# Patient Record
Sex: Female | Born: 1965 | Race: White | Hispanic: No | Marital: Married | Smoking: Never smoker
Health system: Southern US, Community
[De-identification: ages and names within clinical notes are randomized; demographics above are authoritative.]

## PROBLEM LIST (undated history)

## (undated) ENCOUNTER — Emergency Department (HOSPITAL_COMMUNITY): Payer: Self-pay

## (undated) HISTORY — PX: NO PAST SURGERIES: SHX2092

---

## 1999-08-03 ENCOUNTER — Other Ambulatory Visit: Admission: RE | Admit: 1999-08-03 | Discharge: 1999-08-03 | Payer: Self-pay | Admitting: Gynecology

## 2000-08-08 ENCOUNTER — Other Ambulatory Visit: Admission: RE | Admit: 2000-08-08 | Discharge: 2000-08-08 | Payer: Self-pay | Admitting: Gynecology

## 2001-08-19 ENCOUNTER — Other Ambulatory Visit: Admission: RE | Admit: 2001-08-19 | Discharge: 2001-08-19 | Payer: Self-pay | Admitting: Gynecology

## 2002-08-29 ENCOUNTER — Other Ambulatory Visit: Admission: RE | Admit: 2002-08-29 | Discharge: 2002-08-29 | Payer: Self-pay | Admitting: Gynecology

## 2002-09-18 ENCOUNTER — Encounter: Payer: Self-pay | Admitting: Emergency Medicine

## 2002-09-18 ENCOUNTER — Emergency Department (HOSPITAL_COMMUNITY): Admission: EM | Admit: 2002-09-18 | Discharge: 2002-09-18 | Payer: Self-pay | Admitting: Emergency Medicine

## 2003-09-16 ENCOUNTER — Other Ambulatory Visit: Admission: RE | Admit: 2003-09-16 | Discharge: 2003-09-16 | Payer: Self-pay | Admitting: Gynecology

## 2004-10-10 ENCOUNTER — Other Ambulatory Visit: Admission: RE | Admit: 2004-10-10 | Discharge: 2004-10-10 | Payer: Self-pay | Admitting: Gynecology

## 2005-08-11 ENCOUNTER — Emergency Department (HOSPITAL_COMMUNITY): Admission: EM | Admit: 2005-08-11 | Discharge: 2005-08-11 | Payer: Self-pay | Admitting: Family Medicine

## 2005-11-15 ENCOUNTER — Other Ambulatory Visit: Admission: RE | Admit: 2005-11-15 | Discharge: 2005-11-15 | Payer: Self-pay | Admitting: Gynecology

## 2006-11-03 ENCOUNTER — Ambulatory Visit (HOSPITAL_COMMUNITY): Admission: RE | Admit: 2006-11-03 | Discharge: 2006-11-03 | Payer: Self-pay | Admitting: Internal Medicine

## 2006-11-27 ENCOUNTER — Other Ambulatory Visit: Admission: RE | Admit: 2006-11-27 | Discharge: 2006-11-27 | Payer: Self-pay | Admitting: Gynecology

## 2007-11-19 ENCOUNTER — Other Ambulatory Visit: Admission: RE | Admit: 2007-11-19 | Discharge: 2007-11-19 | Payer: Self-pay | Admitting: Gynecology

## 2008-05-29 ENCOUNTER — Encounter: Admission: RE | Admit: 2008-05-29 | Discharge: 2008-05-29 | Payer: Self-pay | Admitting: Gynecology

## 2008-09-26 ENCOUNTER — Emergency Department (HOSPITAL_COMMUNITY): Admission: EM | Admit: 2008-09-26 | Discharge: 2008-09-26 | Payer: Self-pay | Admitting: Emergency Medicine

## 2013-10-08 ENCOUNTER — Emergency Department (HOSPITAL_COMMUNITY): Payer: 59

## 2013-10-08 ENCOUNTER — Emergency Department (HOSPITAL_COMMUNITY)
Admission: EM | Admit: 2013-10-08 | Discharge: 2013-10-09 | Disposition: A | Discharge status: Home / Self Care | Payer: 59 | Attending: Emergency Medicine | Admitting: Emergency Medicine

## 2013-10-08 ENCOUNTER — Encounter (HOSPITAL_COMMUNITY): Payer: Self-pay | Admitting: Emergency Medicine

## 2013-10-08 DIAGNOSIS — R000 Tachycardia, unspecified: Secondary | ICD-10-CM | POA: Insufficient documentation

## 2013-10-08 DIAGNOSIS — R197 Diarrhea, unspecified: Secondary | ICD-10-CM | POA: Insufficient documentation

## 2013-10-08 DIAGNOSIS — M2540 Effusion, unspecified joint: Secondary | ICD-10-CM | POA: Insufficient documentation

## 2013-10-08 DIAGNOSIS — M79601 Pain in right arm: Secondary | ICD-10-CM

## 2013-10-08 DIAGNOSIS — Z880 Allergy status to penicillin: Secondary | ICD-10-CM | POA: Insufficient documentation

## 2013-10-08 DIAGNOSIS — R0602 Shortness of breath: Secondary | ICD-10-CM | POA: Insufficient documentation

## 2013-10-08 DIAGNOSIS — R1110 Vomiting, unspecified: Secondary | ICD-10-CM | POA: Insufficient documentation

## 2013-10-08 DIAGNOSIS — M79609 Pain in unspecified limb: Secondary | ICD-10-CM | POA: Insufficient documentation

## 2013-10-08 DIAGNOSIS — R002 Palpitations: Secondary | ICD-10-CM | POA: Insufficient documentation

## 2013-10-08 LAB — BASIC METABOLIC PANEL
BUN: 11 mg/dL (ref 6–23)
CHLORIDE: 106 meq/L (ref 96–112)
CO2: 24 mEq/L (ref 19–32)
Calcium: 9.2 mg/dL (ref 8.4–10.5)
Creatinine, Ser: 0.75 mg/dL (ref 0.50–1.10)
GFR calc Af Amer: >90 mL/min (ref >90)
GFR calc non Af Amer: >90 mL/min (ref >90)
GLUCOSE: 88 mg/dL (ref 70–99)
POTASSIUM: 4.8 meq/L (ref 3.7–5.3)
Sodium: 144 mEq/L (ref 137–147)

## 2013-10-08 LAB — CBC WITH DIFFERENTIAL/PLATELET
Basophils Absolute: 0 10*3/uL (ref 0.0–0.1)
Basophils Relative: 0 % (ref 0–1)
EOS ABS: 0.1 10*3/uL (ref 0.0–0.7)
Eosinophils Relative: 1 % (ref 0–5)
HCT: 38.3 % (ref 36.0–46.0)
HEMOGLOBIN: 13.2 g/dL (ref 12.0–15.0)
Lymphocytes Relative: 22 % (ref 12–46)
Lymphs Abs: 2.3 10*3/uL (ref 0.7–4.0)
MCH: 29.5 pg (ref 26.0–34.0)
MCHC: 34.5 g/dL (ref 30.0–36.0)
MCV: 85.5 fL (ref 78.0–100.0)
MONOS PCT: 9 % (ref 3–12)
Monocytes Absolute: 1 10*3/uL (ref 0.1–1.0)
NEUTROS ABS: 7 10*3/uL (ref 1.7–7.7)
NEUTROS PCT: 68 % (ref 43–77)
Platelets: 379 10*3/uL (ref 150–400)
RBC: 4.48 MIL/uL (ref 3.87–5.11)
RDW: 12.8 % (ref 11.5–15.5)
WBC: 10.4 10*3/uL (ref 4.0–10.5)

## 2013-10-08 LAB — POCT I-STAT TROPONIN I: Troponin i, poc: 0 ng/mL (ref 0.00–0.08)

## 2013-10-08 NOTE — ED Provider Notes (Signed)
CSN: [id number]     Arrival date & time 10/08/13  2011 History   First MD Initiated Contact with Patient 10/08/13 2113     Chief Complaint  Patient presents with  . Arm Pain  . Shortness of Breath   (Consider location/radiation/quality/duration/timing/severity/associated sxs/prior Treatment) HPI Comments: Pricilla HandlerJill Ann Palau is a 48 y.o. year-old female without a significant past medical history, presenting the Emergency Department with a chief complaint of intermittent right arm pain since 1000 today.  The patient describes the discomfort as cramping pain lasting 1 minute.  She reports self massage of her bicep and deltoid partially relieves the symptoms.  She reports after the episodes she states she has tachycardia and shortness of breath. She denies chest pain, palpitations, or lower extremity edema.  She also denies pleuritic chest pain. Currently asymptomatic in the ED.  The patient states she was asymptomatic at the time when given Nitro by EMS.  She reports a history of diarrhea and vomiting in the week and has an appointment with her PCP tomorrow. No recent travel, family history or personal history of DVT/PE, lower extremity swelling, smoking, cancer, or exogenous estrogen.  LMP:IUD placed PCP: Dr. Sherlyn Lickedmon  Patient is a 48 y.o. female presenting with arm pain and shortness of breath. The history is provided by the patient and the spouse. No language interpreter was used.  Arm Pain Associated symptoms include joint swelling and vomiting. Pertinent negatives include no abdominal pain, chest pain, chills, fever or headaches.  Shortness of Breath Associated symptoms: vomiting   Associated symptoms: no abdominal pain, no chest pain, no fever and no headaches     History reviewed. No pertinent past medical history. History reviewed. No pertinent past surgical history. History reviewed. No pertinent family history. History  Substance Use Topics  . Smoking status: Never Smoker   . Smokeless  tobacco: Never Used  . Alcohol Use: Yes     Comment: occasionally   OB History   Grav Para Term Preterm Abortions TAB SAB Ect Mult Living                 Review of Systems  Constitutional: Negative for fever and chills.  Respiratory: Positive for shortness of breath.   Cardiovascular: Positive for palpitations. Negative for chest pain and leg swelling.  Gastrointestinal: Positive for vomiting and diarrhea. Negative for abdominal pain, constipation and blood in stool.  Musculoskeletal: Positive for joint swelling.  Neurological: Negative for light-headedness and headaches.    Allergies  Aspirin; Penicillins; and Tape  Home Medications   Current Outpatient Rx  Name  Route  Sig  Dispense  Refill  . cyclobenzaprine (FLEXERIL) 10 MG tablet   Oral   Take 1 tablet (10 mg total) by mouth at bedtime. As needed for muscle cramping   10 tablet   0   . HYDROcodone-acetaminophen (NORCO/VICODIN) 5-325 MG per tablet   Oral   Take 1 tablet by mouth every 4 (four) hours as needed. Take with meals   10 tablet   0    BP 107/65  Pulse 87  Temp(Src) 97.7 F (36.5 C) (Oral)  Resp 14  SpO2 99% Physical Exam  Nursing note and vitals reviewed. Constitutional: She is oriented to person, place, and time. She appears well-developed and well-nourished. No distress.  HENT:  Head: Normocephalic and atraumatic.  Eyes: EOM are normal. Pupils are equal, round, and reactive to light.  Neck: Normal range of motion. Neck supple.  Cardiovascular: Normal rate and regular rhythm.  Pulmonary/Chest: Effort normal and breath sounds normal. No respiratory distress. She has no wheezes. She has no rales. She exhibits no tenderness.  Abdominal: Soft. Normal appearance and bowel sounds are normal. There is no tenderness. There is no rebound, no guarding, no tenderness at McBurney's point and negative Murphy's sign.  Musculoskeletal: Normal range of motion.       Right shoulder: She exhibits normal range of  motion, no tenderness, no bony tenderness, no swelling, no effusion, no crepitus, no deformity, no spasm and normal strength.  Full active ROM.  Neurological: She is alert and oriented to person, place, and time. No sensory deficit. She exhibits normal muscle tone.  Reflex Scores:      Bicep reflexes are 2+ on the right side and 2+ on the left side. Skin: Skin is warm and dry. No rash noted. She is not diaphoretic.  Psychiatric: She has a normal mood and affect. Her behavior is normal. Thought content normal.    ED Course  Procedures (including critical care time) Labs Review Labs Reviewed  CBC WITH DIFFERENTIAL  BASIC METABOLIC PANEL  TROPONIN I  POCT I-STAT TROPONIN I   Imaging Review Dg Chest 2 View  10/08/2013   CLINICAL DATA:  Chest pain extending into the right arm.  EXAM: CHEST  2 VIEW  COMPARISON:  None.  FINDINGS: Normal heart size and mediastinal contours. No acute infiltrate or edema. No effusion or pneumothorax. No acute osseous findings.  IMPRESSION: No active cardiopulmonary disease.   Electronically Signed   By: Tiburcio Pea M.D.   On: 10/08/2013 23:04    EKG Interpretation   None       MDM   1. Right arm pain    Pt with a less than 12 hour history of intermittent left arm pain lasting one minute.  Nitro given by EMS, reports she was given nitro when she was asymptomatic, reports episodes after receiving the Nitro.  Exam without acute findings.  Full active ROM to upper extremity without discomfort, no spasms, NV intact. Likely musculoskeletal intermittent spasm, will rule out electrolyte abnormality. ill evaluate for ACS. TIMI Risk Score is 0, less than 5% CBC and BMP without abnormalities. Troponin- negative. 2340: Pt resting comfortably in room remains asymptomatic in ED.  Discussed current lab results and Chest XR results.   Delta Troponin ordered. Discussed patient history, condition, and labs with Dr. Rubin Payor who agrees the patient can be evaluated as an  out-pt. Troponin x2 negative. Intermittent cramping to upper extremity, likely MS in nature, will have the patient follow up with her PCP. Discussed lab results, imaging results, and treatment plan with the patient. Return precautions given. Reports understanding and no other concerns at this time.  Patient is stable for discharge at this time.  Meds given in ED:  Medications - No data to display  Discharge Medication List as of 10/09/2013  1:35 AM         Clabe Seal, PA-C 10/09/13 1437

## 2013-10-08 NOTE — ED Notes (Signed)
Pt reports to the ED for eval of right arm pain, jaw pain, and SOB. Pt reports the intermittent arm pain began today and while she was driving she developed jaw pain and SOB. Pt reports she has also been having GI problems and has an appointment with her PCP tomorrow. Pt reports some chest tightness with the jaw pain and SOB. Denies any jaw pain at this time but the arm pain and SOB have remained. Pt received 1 nitro wen route with no change in her symptoms. Pt did not receive ASA due to an allergy. Pt reports her arm feels like a cramp and that if she rubs her arm the pain will go away but if she cannot rub her arm it gets more painful and numb and tingling. Pt NSR, VSS, and lung sound clear en route. Pt A&Ox4, resp e/u, and skin warm and dry.

## 2013-10-08 NOTE — ED Notes (Signed)
Pt called out reporting right arm pain. Rubin PayorPickering, MD is aware.

## 2013-10-08 NOTE — ED Notes (Signed)
Pt states that she has never had a blood clot before. Pt also states that she has an IUD for birth control.

## 2013-10-09 LAB — TROPONIN I

## 2013-10-09 MED ORDER — HYDROCODONE-ACETAMINOPHEN 5-325 MG PO TABS: 1.0000 | ORAL_TABLET | ORAL | Status: DC | PRN

## 2013-10-09 MED ORDER — CYCLOBENZAPRINE HCL 10 MG PO TABS: 10.0000 mg | ORAL_TABLET | Freq: Every day | ORAL | Status: DC

## 2013-10-09 NOTE — Discharge Instructions (Signed)
Keep your appointment with Dr. Sherlyn Lickedmon for tomorrow. Return if Symptoms worsen.   Take medication as prescribed.  You can take over the counter pain medication during the day if your arm pain persists.

## 2013-10-13 NOTE — ED Provider Notes (Signed)
Medical screening examination/treatment/procedure(s) were performed by non-physician practitioner and as supervising physician I was immediately available for consultation/collaboration.  EKG Interpretation    Date/Time:  Wednesday October 08 2013 20:23:14 EST Ventricular Rate:  76 PR Interval:  162 QRS Duration: 84 QT Interval:  387 QTC Calculation: 435 R Axis:   68 Text Interpretation:  Sinus rhythm Reconfirmed by Rubin PayorPICKERING  MD, Lilyahna Sirmon ([id number]) on 10/13/2013 8:40:38 PM             Juliet RudeNathan R. Rubin PayorPickering, MD 10/13/13 2040

## 2018-08-09 ENCOUNTER — Encounter: Payer: Self-pay | Admitting: Gastroenterology

## 2018-08-15 ENCOUNTER — Ambulatory Visit (AMBULATORY_SURGERY_CENTER): Payer: Self-pay | Admitting: *Deleted

## 2018-08-15 ENCOUNTER — Encounter: Payer: Self-pay | Admitting: Gastroenterology

## 2018-08-15 VITALS — Ht 66.0 in | Wt 197.0 lb

## 2018-08-15 DIAGNOSIS — Z1211 Encounter for screening for malignant neoplasm of colon: Secondary | ICD-10-CM

## 2018-08-15 MED ORDER — NA SULFATE-K SULFATE-MG SULF 17.5-3.13-1.6 GM/177ML PO SOLN: ORAL | 0 refills | Status: DC

## 2018-08-15 MED ORDER — NA SULFATE-K SULFATE-MG SULF 17.5-3.13-1.6 GM/177ML PO SOLN: 1.0000 | Freq: Once | ORAL | 0 refills | Status: DC

## 2018-08-15 NOTE — Progress Notes (Signed)
Patient denies any allergies to eggs or soy. Patient denies any past surgeries. Patient denies any oxygen use at home. Patient denies taking any diet/weight loss medications or blood thinners. EMMI education offered, pt declined. Patient request Suprep not Golytely, she states her husband did that prep recently. She was ok with cost.

## 2018-08-26 ENCOUNTER — Encounter: Payer: Self-pay | Admitting: Gastroenterology

## 2018-08-26 ENCOUNTER — Ambulatory Visit: Payer: Self-pay | Admitting: Gastroenterology

## 2018-08-26 VITALS — BP 103/61 | HR 68 | Temp 98.6°F | Resp 15 | Ht 66.0 in | Wt 197.0 lb

## 2018-08-26 DIAGNOSIS — Z1211 Encounter for screening for malignant neoplasm of colon: Secondary | ICD-10-CM

## 2018-08-26 MED ORDER — SODIUM CHLORIDE 0.9 % IV SOLN: 500.0000 mL | Freq: Once | INTRAVENOUS | Status: DC

## 2018-08-26 NOTE — Progress Notes (Signed)
Report given to PACU, vss 

## 2018-08-26 NOTE — Patient Instructions (Signed)
Discharge instructions given. Normal exam. Resume previous medications. YOU HAD AN ENDOSCOPIC PROCEDURE TODAY AT THE [facility]:   Refer to the procedure report that was given to you for any specific questions about what was found during the examination.  If the procedure report does not answer your questions, please call your gastroenterologist to clarify.  If you requested that your care partner not be given the details of your procedure findings, then the procedure report has been included in a sealed envelope for you to review at your convenience later.  YOU SHOULD EXPECT: Some feelings of bloating in the abdomen. Passage of more gas than usual.  Walking can help get rid of the air that was put into your GI tract during the procedure and reduce the bloating. If you had a lower endoscopy (such as a colonoscopy or flexible sigmoidoscopy) you may notice spotting of blood in your stool or on the toilet paper. If you underwent a bowel prep for your procedure, you may not have a normal bowel movement for a few days.  Please Note:  You might notice some irritation and congestion in your nose or some drainage.  This is from the oxygen used during your procedure.  There is no need for concern and it should clear up in a day or so.  SYMPTOMS TO REPORT IMMEDIATELY:   Following lower endoscopy (colonoscopy or flexible sigmoidoscopy):  Excessive amounts of blood in the stool  Significant tenderness or worsening of abdominal pains  Swelling of the abdomen that is new, acute  Fever of 100F or higher   For urgent or emergent issues, a gastroenterologist can be reached at any hour by calling [phone].   DIET:  We do recommend a small meal at first, but then you may proceed to your regular diet.  Drink plenty of fluids but you should avoid alcoholic beverages for 24 hours.  ACTIVITY:  You should plan to take it easy for the rest of today and you should NOT DRIVE or use heavy machinery  until tomorrow (because of the sedation medicines used during the test).    FOLLOW UP: Our staff will call the number listed on your records the next business day following your procedure to check on you and address any questions or concerns that you may have regarding the information given to you following your procedure. If we do not reach you, we will leave a message.  However, if you are feeling well and you are not experiencing any problems, there is no need to return our call.  We will assume that you have returned to your regular daily activities without incident.  If any biopsies were taken you will be contacted by phone or by letter within the next 1-3 weeks.  Please call us at [phone] if you have not heard about the biopsies in 3 weeks.    SIGNATURES/CONFIDENTIALITY: You and/or your care partner have signed paperwork which will be entered into your electronic medical record.  These signatures attest to the fact that that the information above on your After Visit Summary has been reviewed and is understood.  Full responsibility of the confidentiality of this discharge information lies with you and/or your care-partner. 

## 2018-08-26 NOTE — Op Note (Signed)
[facility] Patient Name: Caroline Dorsey Procedure Date: 08/26/2018 8:54 AM MRN: [id number] Endoscopist: Rachael Feeaniel P Santanna Whitford , MD Age: [age] Referring MD:  Date of Birth: 1966-06-15 Gender: Female Account #: [id number] Procedure:                Colonoscopy Indications:              Screening for colorectal malignant neoplasm Medicines:                Monitored Anesthesia Care Procedure:                Pre-Anesthesia Assessment:                           - Prior to the procedure, a History and Physical                            was performed, and patient medications and                            allergies were reviewed. The patient's tolerance of                            previous anesthesia was also reviewed. The risks                            and benefits of the procedure and the sedation                            options and risks were discussed with the patient.                            All questions were answered, and informed consent                            was obtained. Prior Anticoagulants: The patient has                            taken no previous anticoagulant or antiplatelet                            agents. ASA Grade Assessment: II - A patient with                            mild systemic disease. After reviewing the risks                            and benefits, the patient was deemed in                            satisfactory condition to undergo the procedure.                           After obtaining informed consent, the colonoscope  was passed under direct vision. Throughout the                            procedure, the patient's blood pressure, pulse, and                            oxygen saturations were monitored continuously. The                            Model CF-HQ190L [id number]) scope was introduced                            through the anus and advanced to the the cecum,                            identified by  appendiceal orifice and ileocecal                            valve. The ileocecal valve, appendiceal orifice,                            and rectum were photographed. Scope In: 9:02:07 AM Scope Out: 9:14:45 AM Scope Withdrawal Time: 0 hours 8 minutes 1 second  Total Procedure Duration: 0 hours 12 minutes 38 seconds  Findings:                 The entire examined colon appeared normal on direct                            and retroflexion views. Complications:            No immediate complications. Estimated blood loss:                            None. Impression:               - The entire examined colon is normal on direct and                            retroflexion views.                           - No polyps or cancers. Recommendation:           - Patient has a contact number available for                            emergencies. The signs and symptoms of potential                            delayed complications were discussed with the                            patient. Return to normal activities tomorrow.  Written discharge instructions were provided to the                            patient.                           - Resume previous diet.                           - Continue present medications.                           - Repeat colonoscopy in 10 years for screening. Rachael Feeaniel P Husayn Reim, MD 08/26/2018 9:19:14 AM This report has been signed electronically.

## 2018-08-26 NOTE — Progress Notes (Signed)
Pt's states no medical or surgical changes since previsit or office visit. 

## 2018-08-27 ENCOUNTER — Telehealth: Payer: Self-pay

## 2018-08-27 NOTE — Telephone Encounter (Signed)
  Follow up Call-  Call back number 08/26/2018  Post procedure Call Back phone  # [phone]  Permission to leave phone message Yes  Some recent data might be hidden     Patient questions:  Do you have a fever, pain , or abdominal swelling? No. Pain Score  0 *  Have you tolerated food without any problems? Yes.    Have you been able to return to your normal activities? Yes.    Do you have any questions about your discharge instructions: Diet   No. Medications  No. Follow up visit  No.  Do you have questions or concerns about your Care? No.  Actions: * If pain score is 4 or above: No action needed, pain <4.

## 2019-08-19 DIAGNOSIS — Z1231 Encounter for screening mammogram for malignant neoplasm of breast: Secondary | ICD-10-CM | POA: Diagnosis not present

## 2019-08-19 DIAGNOSIS — Z13820 Encounter for screening for osteoporosis: Secondary | ICD-10-CM | POA: Diagnosis not present

## 2019-08-19 DIAGNOSIS — Z6832 Body mass index (BMI) 32.0-32.9, adult: Secondary | ICD-10-CM | POA: Diagnosis not present

## 2019-08-19 DIAGNOSIS — Z01419 Encounter for gynecological examination (general) (routine) without abnormal findings: Secondary | ICD-10-CM | POA: Diagnosis not present

## 2019-08-19 DIAGNOSIS — N959 Unspecified menopausal and perimenopausal disorder: Secondary | ICD-10-CM | POA: Diagnosis not present

## 2019-09-19 DIAGNOSIS — M25562 Pain in left knee: Secondary | ICD-10-CM | POA: Diagnosis not present

## 2019-09-19 DIAGNOSIS — S83412A Sprain of medial collateral ligament of left knee, initial encounter: Secondary | ICD-10-CM | POA: Diagnosis not present

## 2019-10-10 DIAGNOSIS — M25562 Pain in left knee: Secondary | ICD-10-CM | POA: Diagnosis not present

## 2019-11-14 DIAGNOSIS — M25562 Pain in left knee: Secondary | ICD-10-CM | POA: Diagnosis not present

## 2019-12-05 ENCOUNTER — Ambulatory Visit: Payer: 59 | Attending: Internal Medicine

## 2019-12-05 DIAGNOSIS — Z23 Encounter for immunization: Secondary | ICD-10-CM

## 2019-12-05 NOTE — Progress Notes (Signed)
   Covid-19 Vaccination Clinic  Name:  Kadey Mihalic    MRN: [id number] DOB: 1966-02-29  12/05/2019  Ms. Bartle was observed post Covid-19 immunization for 15 minutes without incident. She was provided with Vaccine Information Sheet and instruction to access the V-Safe system.   Ms. Woehrle was instructed to call 911 with any severe reactions post vaccine: Marland Kitchen Difficulty breathing  . Swelling of face and throat  . A fast heartbeat  . A bad rash all over body  . Dizziness and weakness   Immunizations Administered    Name Date Dose VIS Date Route   Pfizer COVID-19 Vaccine 12/05/2019  3:25 PM 0.3 mL 08/15/2019 Intramuscular   Manufacturer: [employer], [employer]   Lot: [id number]   NDC: [address]-7

## 2019-12-29 ENCOUNTER — Ambulatory Visit: Payer: Self-pay | Attending: Internal Medicine

## 2019-12-29 DIAGNOSIS — Z23 Encounter for immunization: Secondary | ICD-10-CM

## 2019-12-29 NOTE — Progress Notes (Signed)
   Covid-19 Vaccination Clinic  Name:  Caroline Dorsey    MRN: [id number] DOB: Jan 10, 1966  12/29/2019  Caroline Dorsey was observed post Covid-19 immunization for 15 minutes without incident. She was provided with Vaccine Information Sheet and instruction to access the V-Safe system.   Caroline Dorsey was instructed to call 911 with any severe reactions post vaccine: Marland Kitchen Difficulty breathing  . Swelling of face and throat  . A fast heartbeat  . A bad rash all over body  . Dizziness and weakness   Immunizations Administered    Name Date Dose VIS Date Route   Pfizer COVID-19 Vaccine 12/29/2019  3:51 PM 0.3 mL 10/29/2018 Intramuscular   Manufacturer: [employer], [employer]   Lot: [id number]   NDC: [address]-0      Covid-19 Vaccination Clinic  Name:  Caroline Dorsey    MRN: [id number] DOB: [birth year]  12/29/2019  Caroline Dorsey was observed post Covid-19 immunization for 15 minutes without incident. She was provided with Vaccine Information Sheet and instruction to access the V-Safe system.   Caroline Dorsey was instructed to call 911 with any severe reactions post vaccine: Marland Kitchen Difficulty breathing  . Swelling of face and throat  . A fast heartbeat  . A bad rash all over body  . Dizziness and weakness   Immunizations Administered    Name Date Dose VIS Date Route   Pfizer COVID-19 Vaccine 12/29/2019  3:51 PM 0.3 mL 10/29/2018 Intramuscular   Manufacturer: [employer], [employer]   Lot: [id number]   NDC: [address]-0

## 2020-01-26 DIAGNOSIS — Z833 Family history of diabetes mellitus: Secondary | ICD-10-CM | POA: Diagnosis not present

## 2020-01-26 DIAGNOSIS — Z0000 Encounter for general adult medical examination without abnormal findings: Secondary | ICD-10-CM | POA: Diagnosis not present

## 2020-01-26 DIAGNOSIS — Z13220 Encounter for screening for lipoid disorders: Secondary | ICD-10-CM | POA: Diagnosis not present

## 2020-01-26 DIAGNOSIS — Z23 Encounter for immunization: Secondary | ICD-10-CM | POA: Diagnosis not present

## 2020-04-12 DIAGNOSIS — Z23 Encounter for immunization: Secondary | ICD-10-CM | POA: Diagnosis not present

## 2020-09-10 DIAGNOSIS — Z01419 Encounter for gynecological examination (general) (routine) without abnormal findings: Secondary | ICD-10-CM | POA: Diagnosis not present

## 2020-09-10 DIAGNOSIS — Z1231 Encounter for screening mammogram for malignant neoplasm of breast: Secondary | ICD-10-CM | POA: Diagnosis not present

## 2020-09-10 DIAGNOSIS — Z6830 Body mass index (BMI) 30.0-30.9, adult: Secondary | ICD-10-CM | POA: Diagnosis not present

## 2020-09-14 DIAGNOSIS — Z01419 Encounter for gynecological examination (general) (routine) without abnormal findings: Secondary | ICD-10-CM | POA: Diagnosis not present

## 2021-05-18 DIAGNOSIS — Z0000 Encounter for general adult medical examination without abnormal findings: Secondary | ICD-10-CM | POA: Diagnosis not present

## 2021-05-18 DIAGNOSIS — Z13220 Encounter for screening for lipoid disorders: Secondary | ICD-10-CM | POA: Diagnosis not present

## 2021-12-29 ENCOUNTER — Other Ambulatory Visit: Payer: Self-pay | Admitting: Obstetrics and Gynecology

## 2021-12-29 DIAGNOSIS — N6320 Unspecified lump in the left breast, unspecified quadrant: Secondary | ICD-10-CM

## 2022-01-09 ENCOUNTER — Ambulatory Visit
Admission: RE | Admit: 2022-01-09 | Discharge: 2022-01-09 | Disposition: A | Discharge status: Home / Self Care | Payer: Commercial Managed Care - PPO | Source: Ambulatory Visit | Attending: Obstetrics and Gynecology | Admitting: Obstetrics and Gynecology

## 2022-01-09 ENCOUNTER — Ambulatory Visit
Admission: RE | Admit: 2022-01-09 | Discharge: 2022-01-09 | Disposition: A | Discharge status: Home / Self Care | Payer: Self-pay | Source: Ambulatory Visit | Attending: Obstetrics and Gynecology | Admitting: Obstetrics and Gynecology

## 2022-01-09 DIAGNOSIS — N6320 Unspecified lump in the left breast, unspecified quadrant: Secondary | ICD-10-CM

## 2022-05-29 DIAGNOSIS — Z0000 Encounter for general adult medical examination without abnormal findings: Secondary | ICD-10-CM | POA: Diagnosis not present

## 2022-05-29 DIAGNOSIS — Z13220 Encounter for screening for lipoid disorders: Secondary | ICD-10-CM | POA: Diagnosis not present

## 2022-07-09 DIAGNOSIS — U071 COVID-19: Secondary | ICD-10-CM | POA: Diagnosis not present

## 2022-07-09 DIAGNOSIS — R509 Fever, unspecified: Secondary | ICD-10-CM | POA: Diagnosis not present

## 2022-11-01 DIAGNOSIS — B070 Plantar wart: Secondary | ICD-10-CM | POA: Diagnosis not present

## 2022-11-01 DIAGNOSIS — B354 Tinea corporis: Secondary | ICD-10-CM | POA: Diagnosis not present

## 2022-12-19 DIAGNOSIS — Z1329 Encounter for screening for other suspected endocrine disorder: Secondary | ICD-10-CM | POA: Diagnosis not present

## 2022-12-19 DIAGNOSIS — Z130 Encounter for screening for diseases of the blood and blood-forming organs and certain disorders involving the immune mechanism: Secondary | ICD-10-CM | POA: Diagnosis not present

## 2022-12-19 DIAGNOSIS — Z13228 Encounter for screening for other metabolic disorders: Secondary | ICD-10-CM | POA: Diagnosis not present

## 2022-12-19 DIAGNOSIS — Z131 Encounter for screening for diabetes mellitus: Secondary | ICD-10-CM | POA: Diagnosis not present

## 2023-05-31 DIAGNOSIS — E7800 Pure hypercholesterolemia, unspecified: Secondary | ICD-10-CM | POA: Diagnosis not present

## 2023-05-31 DIAGNOSIS — Z131 Encounter for screening for diabetes mellitus: Secondary | ICD-10-CM | POA: Diagnosis not present

## 2023-05-31 DIAGNOSIS — Z0000 Encounter for general adult medical examination without abnormal findings: Secondary | ICD-10-CM | POA: Diagnosis not present

## 2023-10-11 DIAGNOSIS — Z6826 Body mass index (BMI) 26.0-26.9, adult: Secondary | ICD-10-CM | POA: Diagnosis not present

## 2023-10-11 DIAGNOSIS — Z713 Dietary counseling and surveillance: Secondary | ICD-10-CM | POA: Diagnosis not present

## 2024-02-04 DIAGNOSIS — Z6824 Body mass index (BMI) 24.0-24.9, adult: Secondary | ICD-10-CM | POA: Diagnosis not present

## 2024-02-04 DIAGNOSIS — Z01419 Encounter for gynecological examination (general) (routine) without abnormal findings: Secondary | ICD-10-CM | POA: Diagnosis not present

## 2024-02-04 DIAGNOSIS — Z1231 Encounter for screening mammogram for malignant neoplasm of breast: Secondary | ICD-10-CM | POA: Diagnosis not present

## 2024-02-04 DIAGNOSIS — Z124 Encounter for screening for malignant neoplasm of cervix: Secondary | ICD-10-CM | POA: Diagnosis not present

## 2024-03-06 DIAGNOSIS — Z6824 Body mass index (BMI) 24.0-24.9, adult: Secondary | ICD-10-CM | POA: Diagnosis not present

## 2024-03-06 DIAGNOSIS — Z713 Dietary counseling and surveillance: Secondary | ICD-10-CM | POA: Diagnosis not present

## 2024-03-16 IMAGING — US US BREAST*L* LIMITED INC AXILLA
1 series · 2 of 2 positions shown · non-contrast
Comparison: Previous examinations, including the bilateral
screening mammogram dated 12/20/2021.

CLINICAL DATA: Mass felt on recent physical examination in the
outer left breast.

EXAM:
DIGITAL DIAGNOSTIC UNILATERAL LEFT MAMMOGRAM WITH TOMOSYNTHESIS AND
CAD; ULTRASOUND LEFT BREAST LIMITED
TECHNIQUE: Left digital diagnostic mammography and breast tomosynthesis was
performed. The images were evaluated with computer-aided detection.;
Targeted ultrasound examination of the left breast was performed.

[Series 1: us breast*left* limited inc axilla · 0.07mm/px · 2 of 2 slices shown]
[im 1/2]
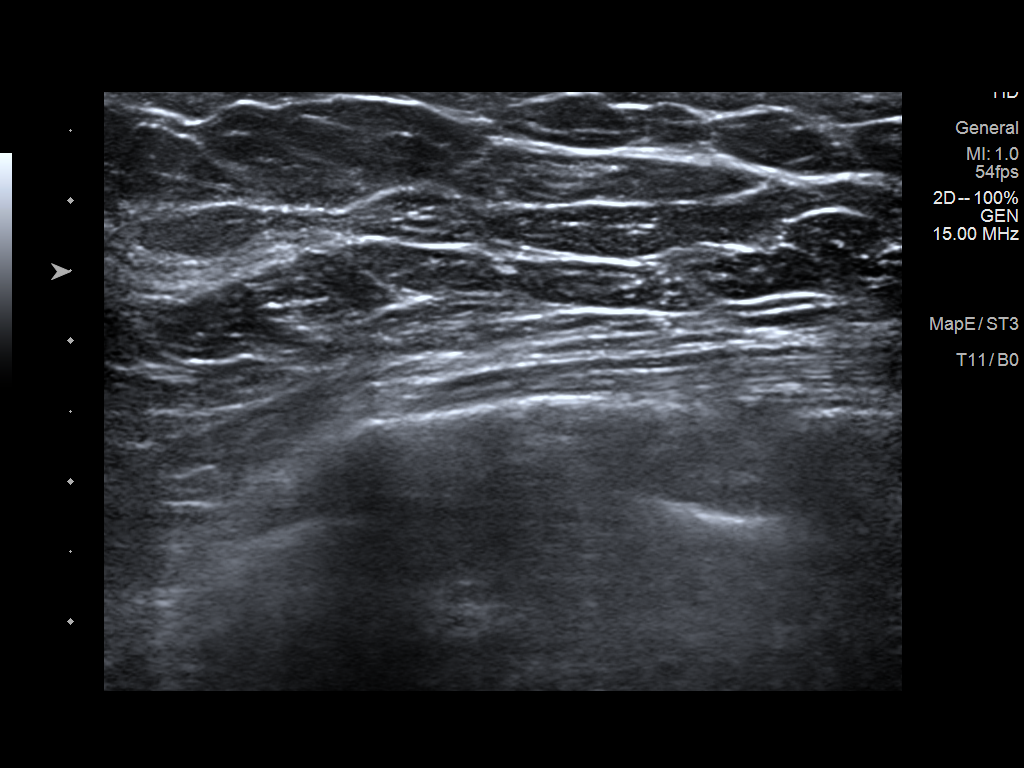
[im 2/2]
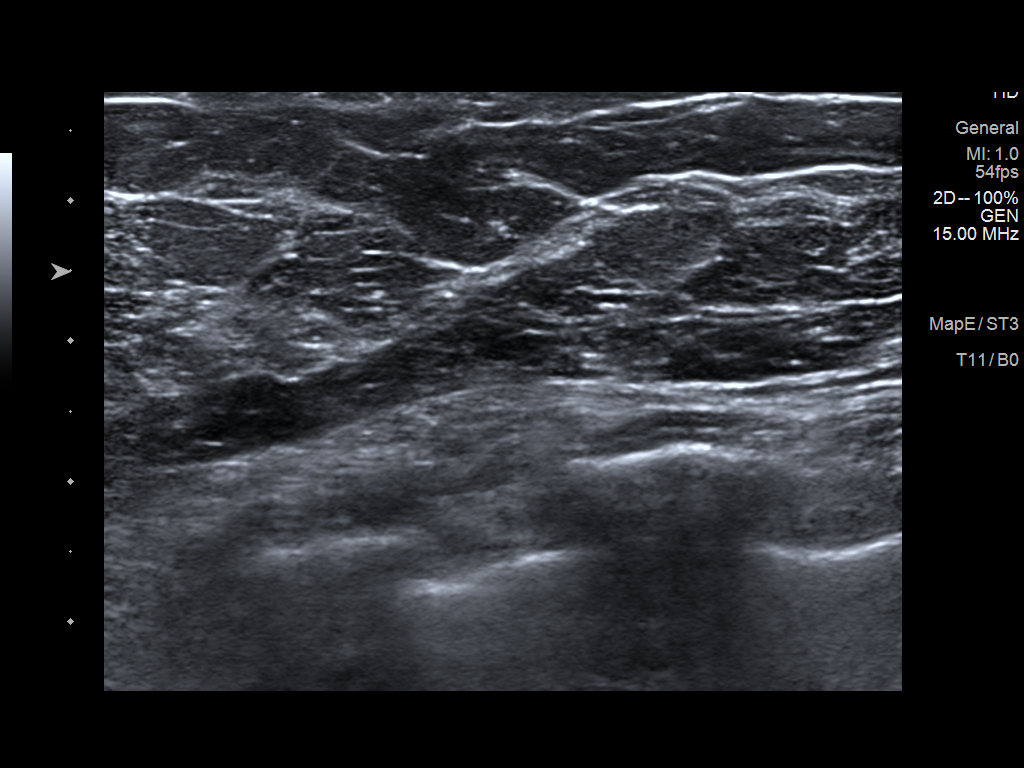

[2 of 2 positions shown; findings below may reference images not displayed]

ACR Breast Density Category b: There are scattered areas of
fibroglandular density.
FINDINGS: Normal appearing inferior axillary lymph node region of the recently
palpated mass, marked a metallic marker. This is unchanged compared
to previous examinations.

On physical exam, there are multiple palpable fat lobules in the
outer left breast in the region of recent palpable concern.

Targeted ultrasound is performed, showing normal appearing fat
lobules throughout the outer left breast in the region of recent
palpable concern.
IMPRESSION: No evidence of malignancy.

RECOMMENDATION:
Bilateral screening mammogram in December 2022 when due.

I have discussed the findings and recommendations with the patient.
If applicable, a reminder letter will be sent to the patient
regarding the next appointment.

BI-RADS CATEGORY  1: Negative.

## 2024-03-16 IMAGING — MG MM DIGITAL DIAGNOSTIC UNILAT*L* W/ TOMO W/ CAD
4 series · 4 of 12 positions shown · non-contrast
Comparison: Previous examinations, including the bilateral
screening mammogram dated 12/20/2021.

CLINICAL DATA: Mass felt on recent physical examination in the
outer left breast.

EXAM:
DIGITAL DIAGNOSTIC UNILATERAL LEFT MAMMOGRAM WITH TOMOSYNTHESIS AND
CAD; ULTRASOUND LEFT BREAST LIMITED
TECHNIQUE: Left digital diagnostic mammography and breast tomosynthesis was
performed. The images were evaluated with computer-aided detection.;
Targeted ultrasound examination of the left breast was performed.

[L XCCL synth-2D]
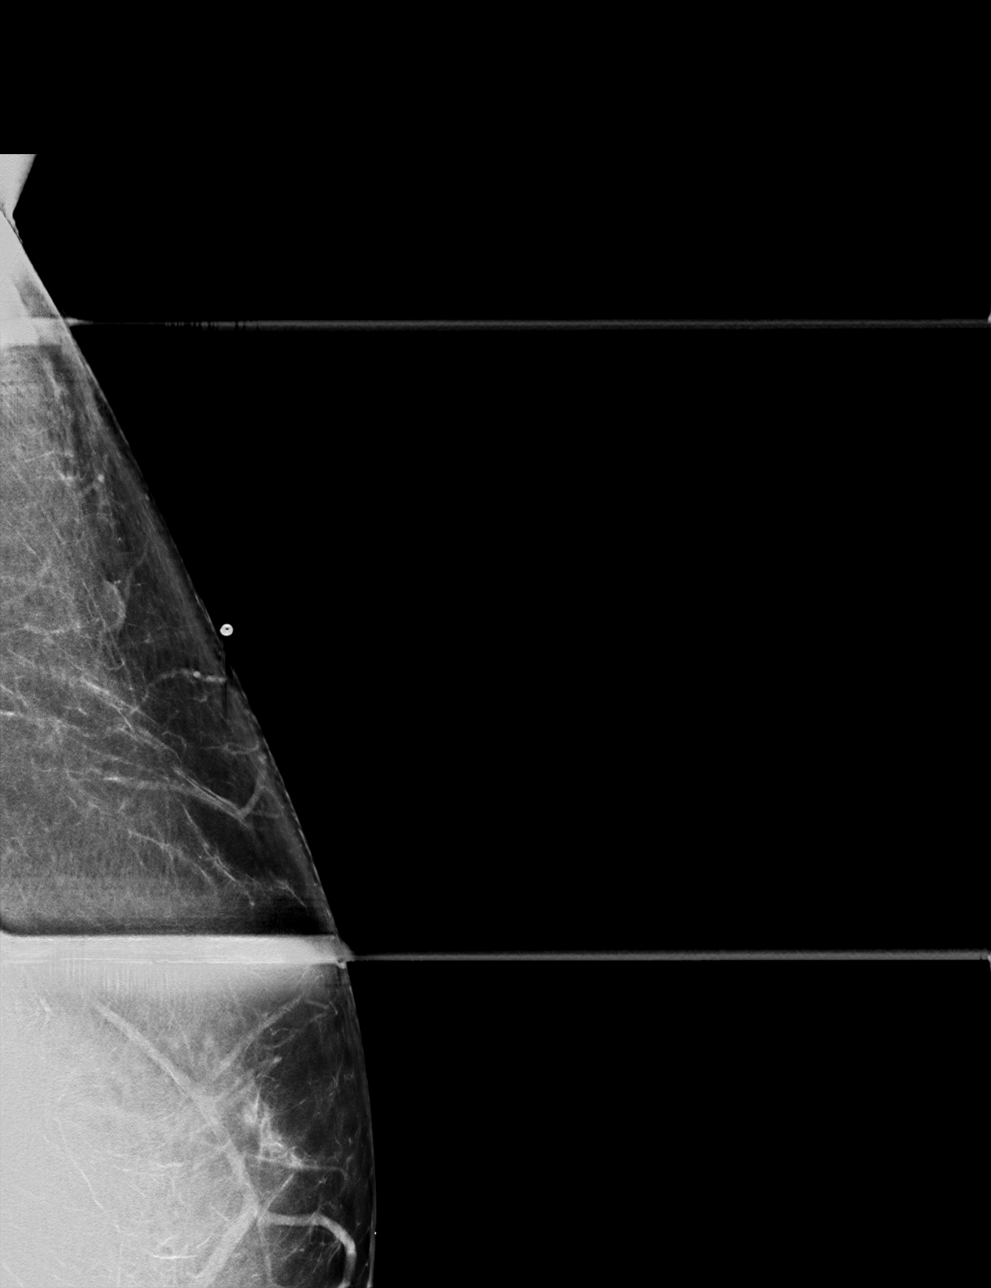

[L CC synth-2D]
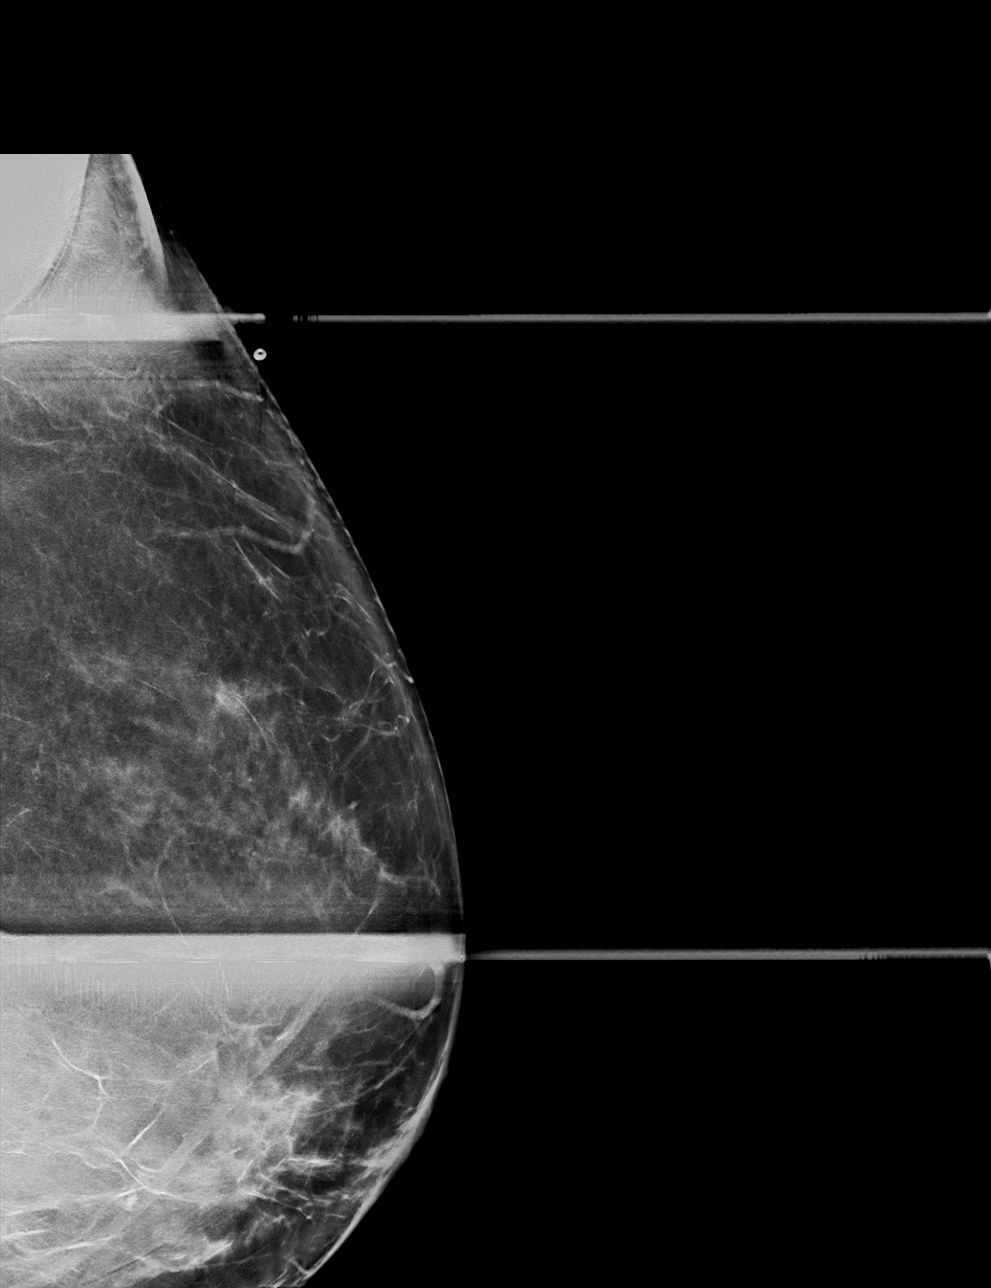

[L XCCL tomo · tomo slice 38/75.0]
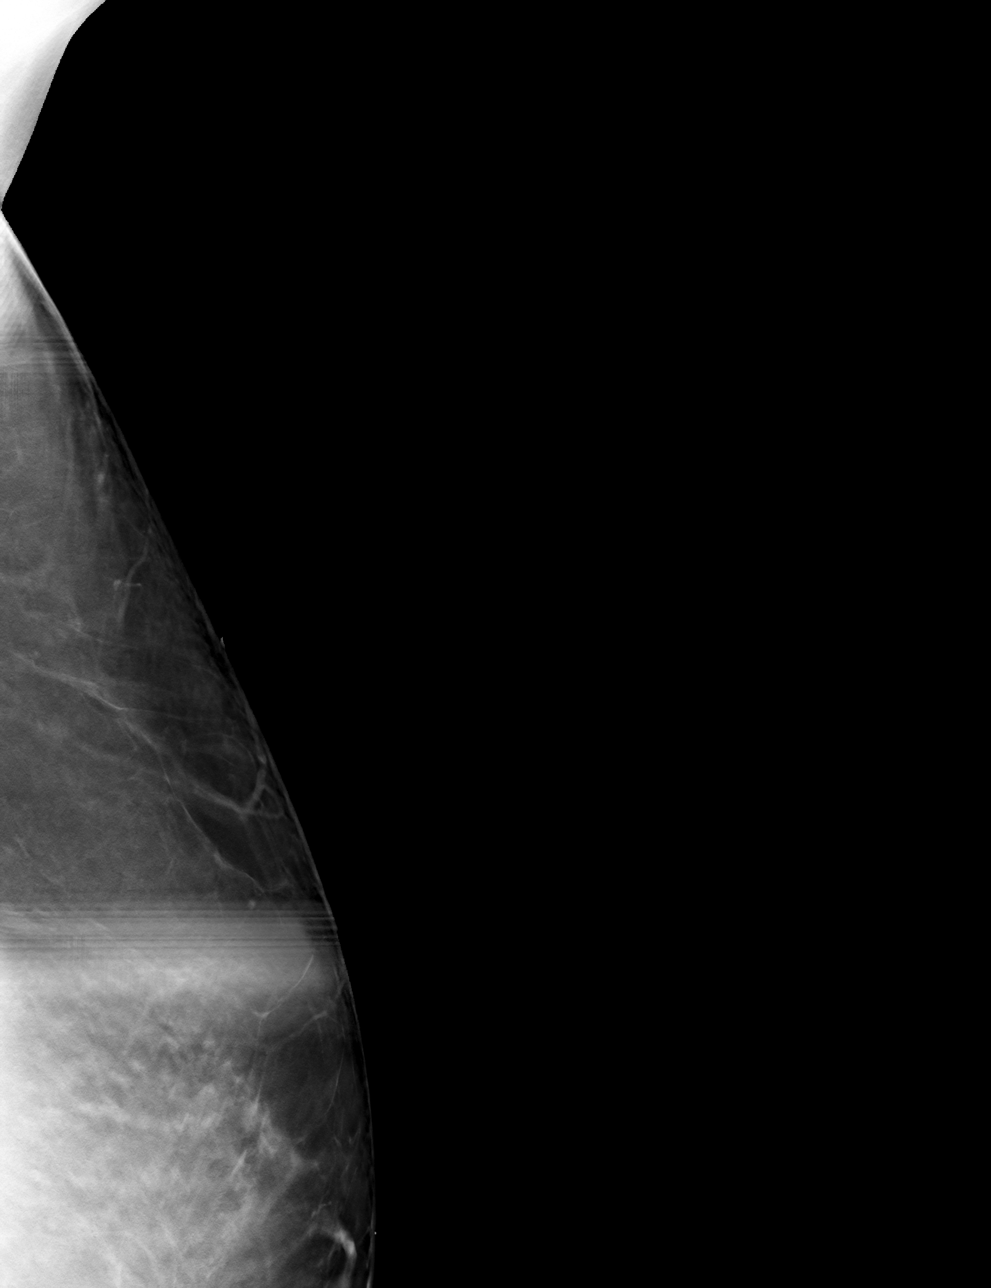

[L CC tomo · tomo slice 39/78.0]
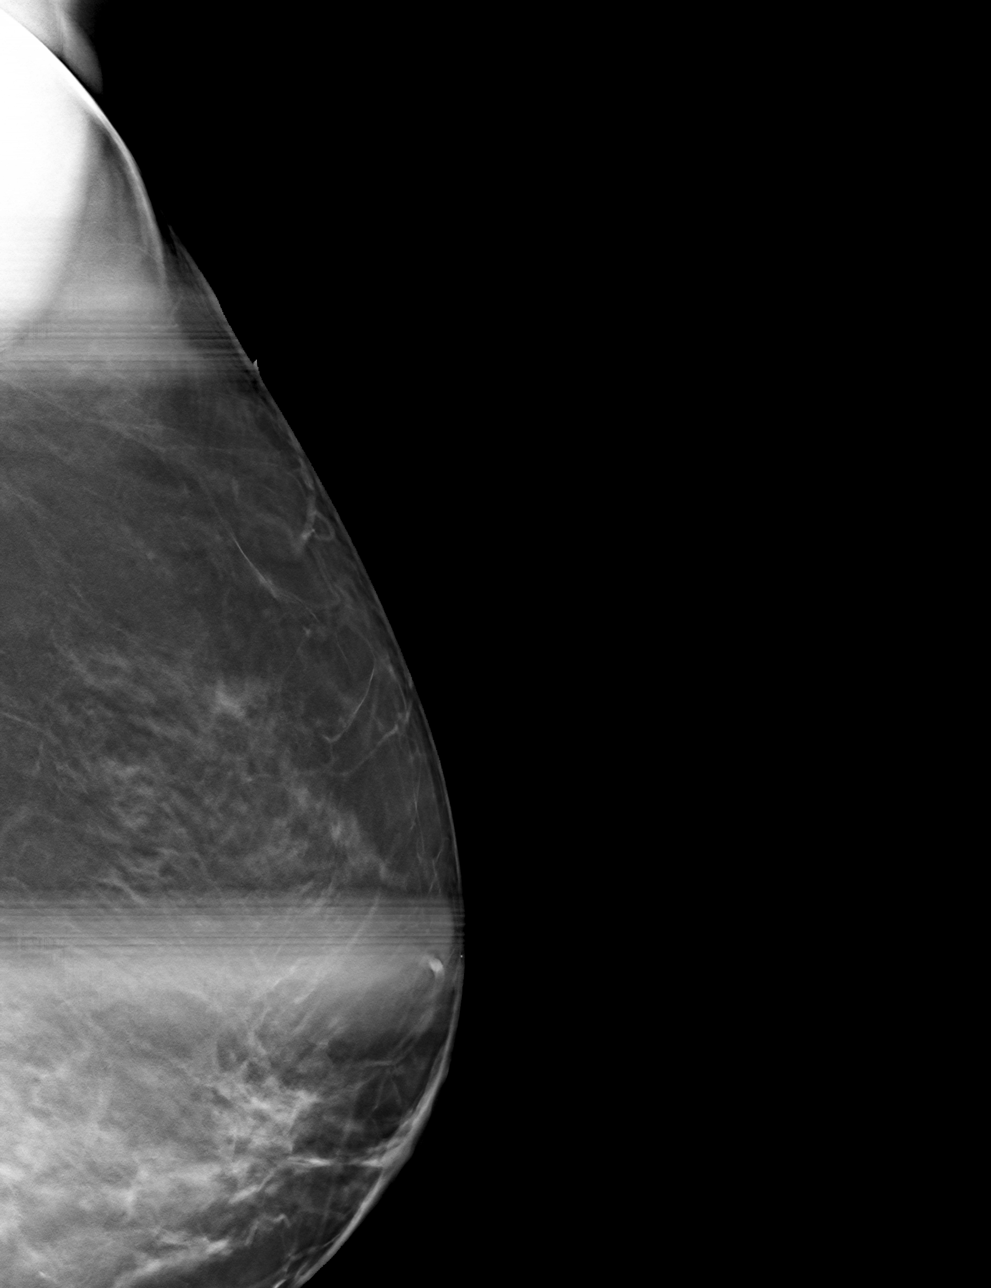

[4 of 12 positions shown; findings below may reference images not displayed]

ACR Breast Density Category b: There are scattered areas of
fibroglandular density.
FINDINGS: Normal appearing inferior axillary lymph node region of the recently
palpated mass, marked a metallic marker. This is unchanged compared
to previous examinations.

On physical exam, there are multiple palpable fat lobules in the
outer left breast in the region of recent palpable concern.

Targeted ultrasound is performed, showing normal appearing fat
lobules throughout the outer left breast in the region of recent
palpable concern.
IMPRESSION: No evidence of malignancy.

RECOMMENDATION:
Bilateral screening mammogram in December 2022 when due.

I have discussed the findings and recommendations with the patient.
If applicable, a reminder letter will be sent to the patient
regarding the next appointment.

BI-RADS CATEGORY  1: Negative.

## 2024-04-24 DIAGNOSIS — Z6823 Body mass index (BMI) 23.0-23.9, adult: Secondary | ICD-10-CM | POA: Diagnosis not present

## 2024-04-24 DIAGNOSIS — Z713 Dietary counseling and surveillance: Secondary | ICD-10-CM | POA: Diagnosis not present

## 2024-05-12 DIAGNOSIS — Z0000 Encounter for general adult medical examination without abnormal findings: Secondary | ICD-10-CM | POA: Diagnosis not present

## 2024-05-12 DIAGNOSIS — E7800 Pure hypercholesterolemia, unspecified: Secondary | ICD-10-CM | POA: Diagnosis not present

## 2024-05-12 DIAGNOSIS — Z131 Encounter for screening for diabetes mellitus: Secondary | ICD-10-CM | POA: Diagnosis not present

## 2024-05-12 DIAGNOSIS — Z13220 Encounter for screening for lipoid disorders: Secondary | ICD-10-CM | POA: Diagnosis not present

## 2024-06-16 DIAGNOSIS — Z6823 Body mass index (BMI) 23.0-23.9, adult: Secondary | ICD-10-CM | POA: Diagnosis not present

## 2024-06-16 DIAGNOSIS — Z713 Dietary counseling and surveillance: Secondary | ICD-10-CM | POA: Diagnosis not present

## 2024-08-18 DIAGNOSIS — Z6823 Body mass index (BMI) 23.0-23.9, adult: Secondary | ICD-10-CM | POA: Diagnosis not present

## 2024-08-18 DIAGNOSIS — Z713 Dietary counseling and surveillance: Secondary | ICD-10-CM | POA: Diagnosis not present
